# Patient Record
Sex: Male | Born: 1935 | Marital: Married
Health system: Southern US, Community
[De-identification: ages and names within clinical notes are randomized; demographics above are authoritative.]

---

## 2003-10-16 ENCOUNTER — Other Ambulatory Visit: Payer: Self-pay

## 2005-09-23 ENCOUNTER — Ambulatory Visit: Payer: Self-pay | Admitting: Surgery

## 2005-09-24 ENCOUNTER — Ambulatory Visit: Payer: Self-pay | Admitting: Surgery

## 2006-10-18 ENCOUNTER — Ambulatory Visit: Payer: Self-pay | Admitting: Surgery

## 2007-08-11 ENCOUNTER — Ambulatory Visit: Payer: Self-pay | Admitting: Internal Medicine

## 2007-10-25 ENCOUNTER — Other Ambulatory Visit: Payer: Self-pay

## 2007-10-26 ENCOUNTER — Inpatient Hospital Stay: Payer: Self-pay | Admitting: Internal Medicine

## 2007-10-28 ENCOUNTER — Other Ambulatory Visit: Payer: Self-pay

## 2007-10-30 ENCOUNTER — Other Ambulatory Visit: Payer: Self-pay

## 2007-10-31 ENCOUNTER — Other Ambulatory Visit: Payer: Self-pay

## 2007-11-21 ENCOUNTER — Ambulatory Visit: Payer: Self-pay | Admitting: Surgery

## 2008-12-03 ENCOUNTER — Ambulatory Visit: Payer: Self-pay | Admitting: Ophthalmology

## 2008-12-11 ENCOUNTER — Ambulatory Visit: Payer: Self-pay | Admitting: Ophthalmology

## 2010-07-16 ENCOUNTER — Ambulatory Visit: Payer: Self-pay | Admitting: Cardiology

## 2010-07-17 ENCOUNTER — Ambulatory Visit: Payer: Self-pay | Admitting: Cardiology

## 2011-03-12 ENCOUNTER — Ambulatory Visit: Payer: Self-pay | Admitting: Unknown Physician Specialty

## 2011-04-26 ENCOUNTER — Ambulatory Visit: Payer: Self-pay | Admitting: Surgery

## 2011-05-03 ENCOUNTER — Ambulatory Visit: Payer: Self-pay | Admitting: Surgery

## 2011-05-05 LAB — PATHOLOGY REPORT

## 2011-05-08 ENCOUNTER — Inpatient Hospital Stay: Payer: Self-pay | Admitting: Internal Medicine

## 2011-05-15 DIAGNOSIS — R079 Chest pain, unspecified: Secondary | ICD-10-CM

## 2011-05-18 LAB — BASIC METABOLIC PANEL
Anion Gap: 10 (ref 7–16)
BUN: 69 mg/dL — ABNORMAL HIGH (ref 7–18)
Calcium, Total: 9.4 mg/dL (ref 8.5–10.1)
Co2: 31 mmol/L (ref 21–32)
Creatinine: 2 mg/dL — ABNORMAL HIGH (ref 0.60–1.30)
EGFR (African American): 41 — ABNORMAL LOW
Osmolality: 310 (ref 275–301)

## 2011-06-18 DEATH — deceased

## 2012-08-17 IMAGING — CR DG CHEST 1V PORT
1 series · 1 of 1 positions shown · non-contrast
Comparison: none

REASON FOR EXAM: dyspnea
COMMENTS:

[portable]
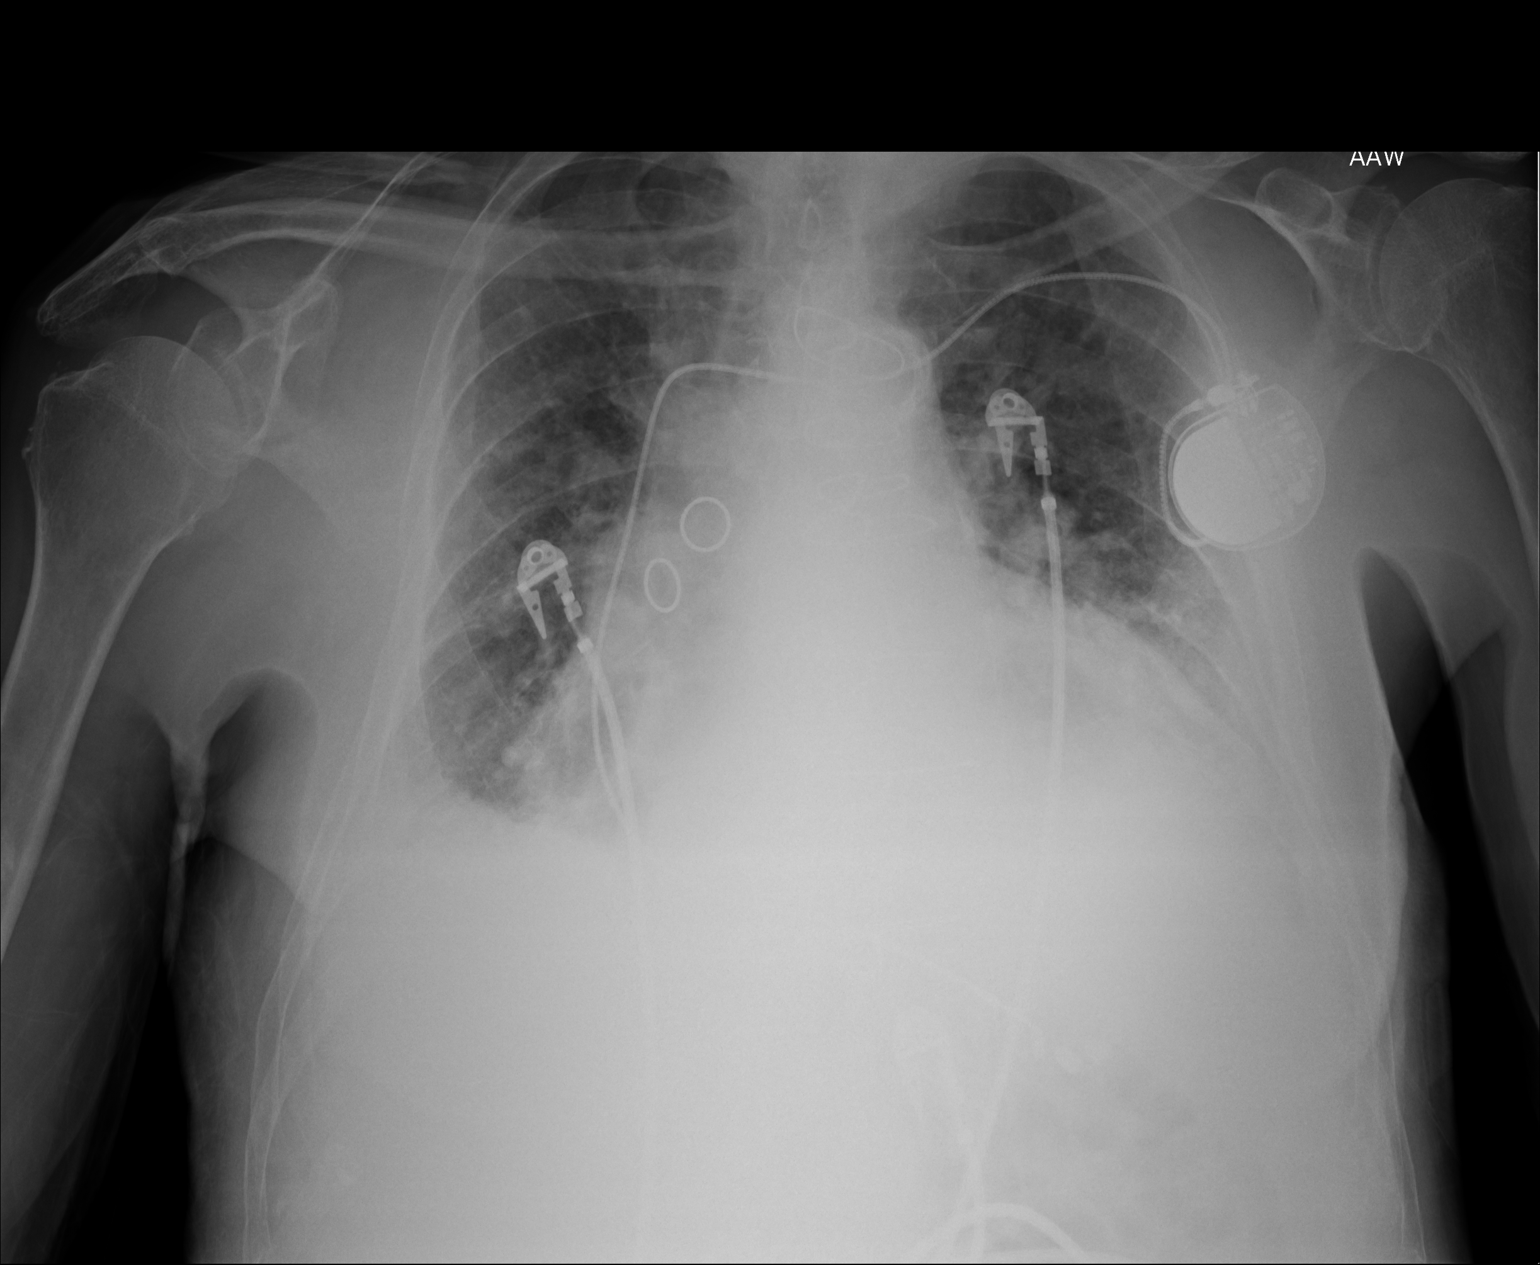

[1 of 1 positions shown; findings below may reference images not displayed]

PROCEDURE:     DXR - DXR PORTABLE CHEST SINGLE VIEW  - May 08, 2011  [DATE]

RESULT:     Comparison is made to the study April 26, 2011.

The lungs are less well inflated. There is blunting of the costophrenic
gutters and the left hemidiaphragm is less well demonstrated. The permanent
pacemaker is unchanged in appearance. The cardiac silhouette remains
enlarged. The pulmonary vascularity is engorged and the interstitial
markings of both lungs are mildly increased.
IMPRESSION: The findings are consistent with low-grade CHF. Small
bilateral pleural effusions have likely developed. I see no focal pneumonia.

## 2012-08-18 IMAGING — US US RENAL KIDNEY
1 series · 17 of 25 positions shown · non-contrast
Comparison: none

REASON FOR EXAM: Acute Renal Failure
COMMENTS:

[Series 1: us renal kidney · 17 of 27 slices shown]
[im 1/27]
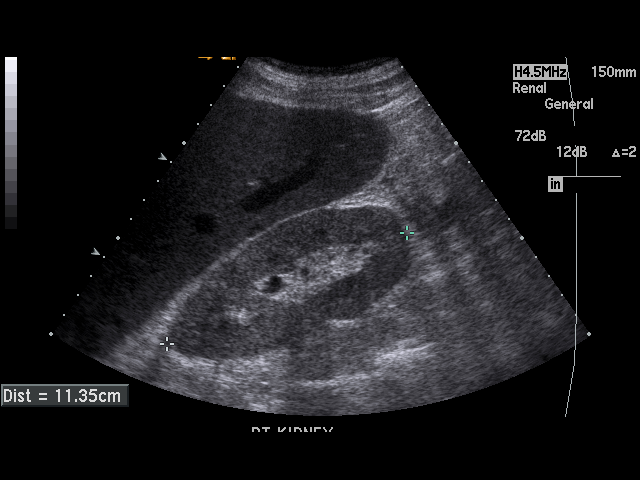
[im 3/27]
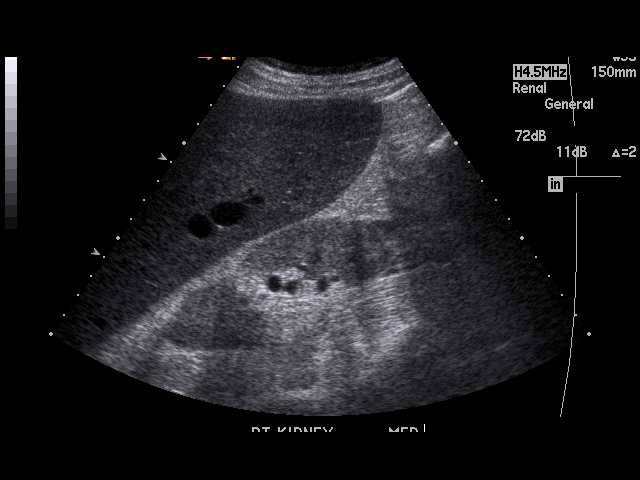
[im 4/27]
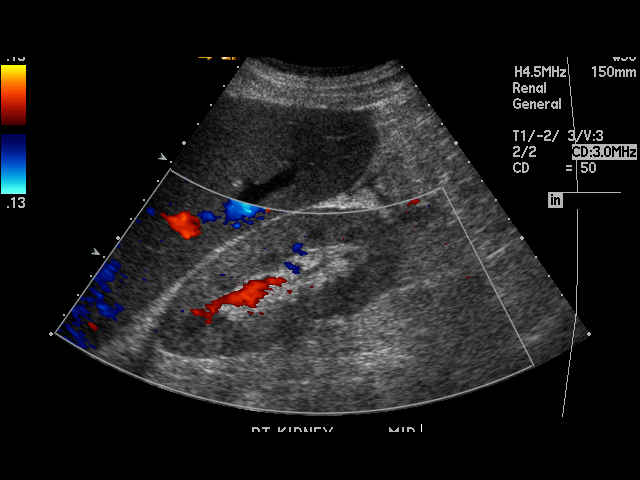
[im 6/27]
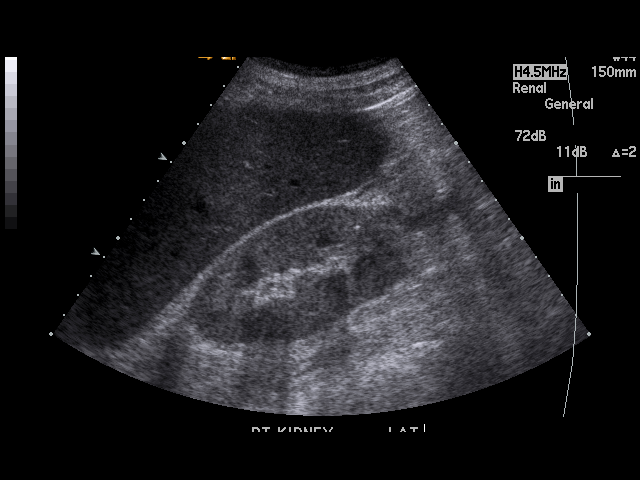
[im 7/27]
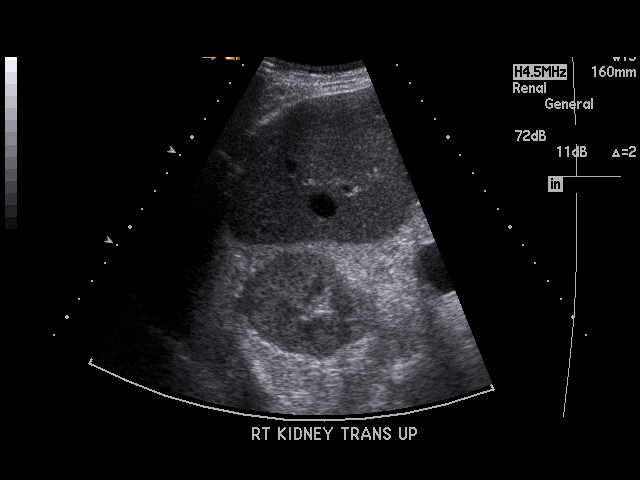
[im 9/27]
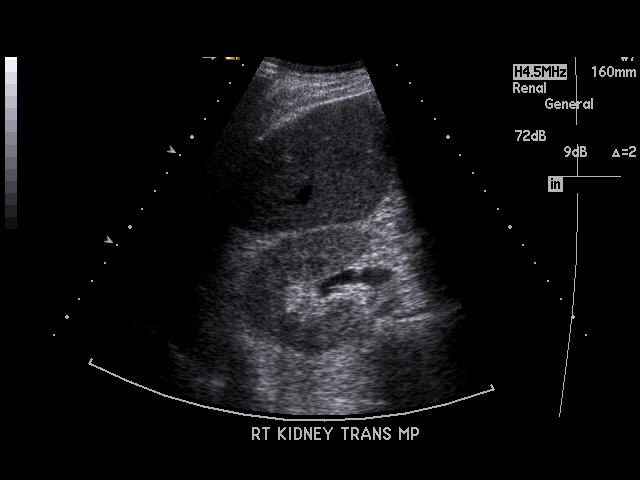
[im 10/27]
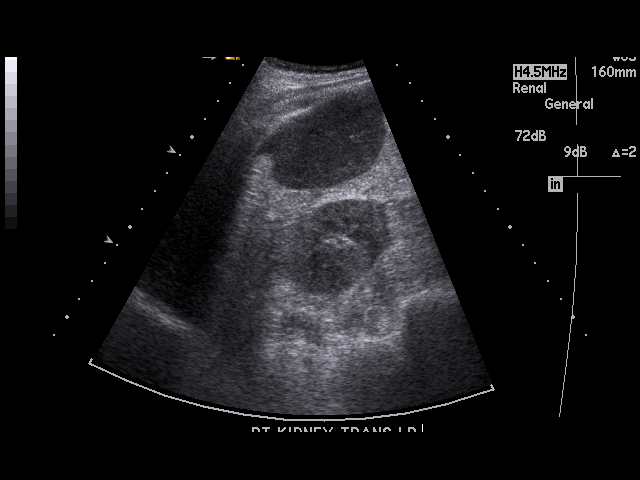
[im 12/27]
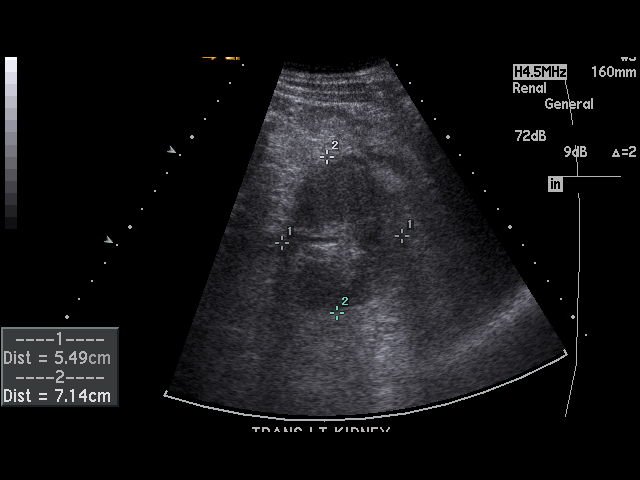
[im 14/27]
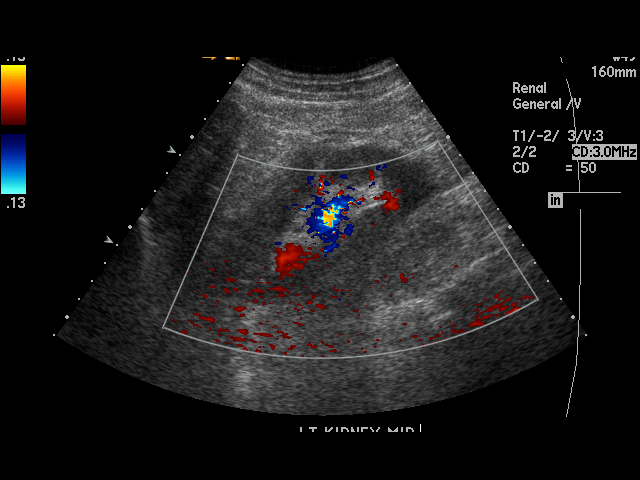
[im 15/27]
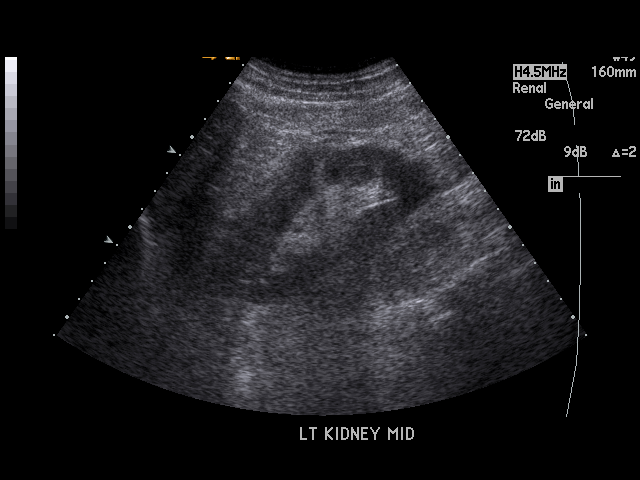
[im 17/27]
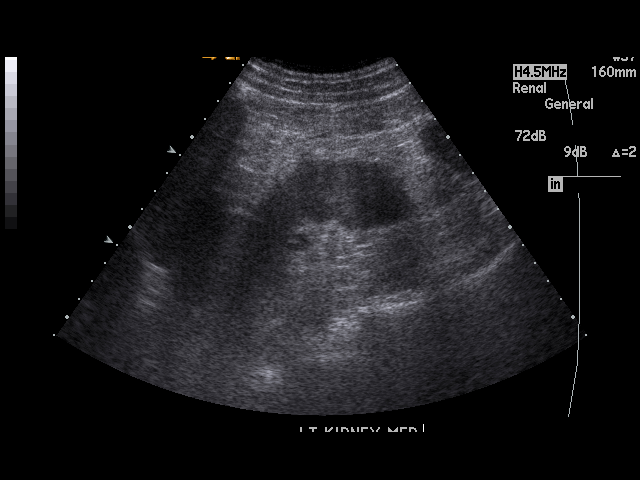
[im 18/27]
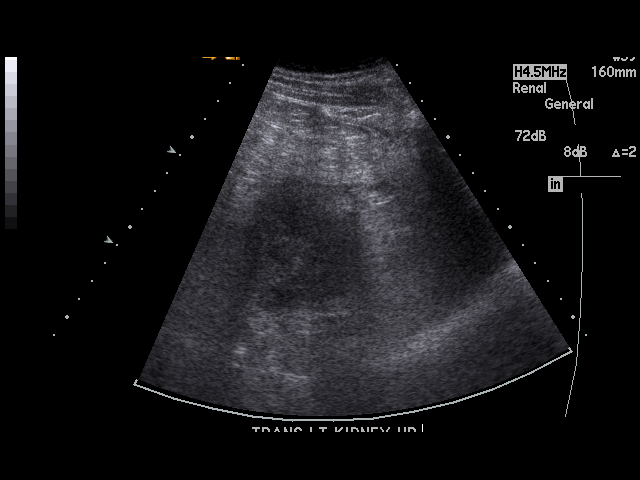
[im 20/27]
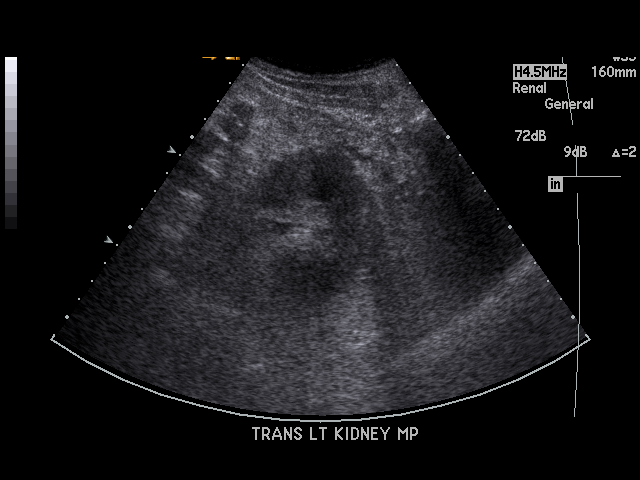
[im 21/27]
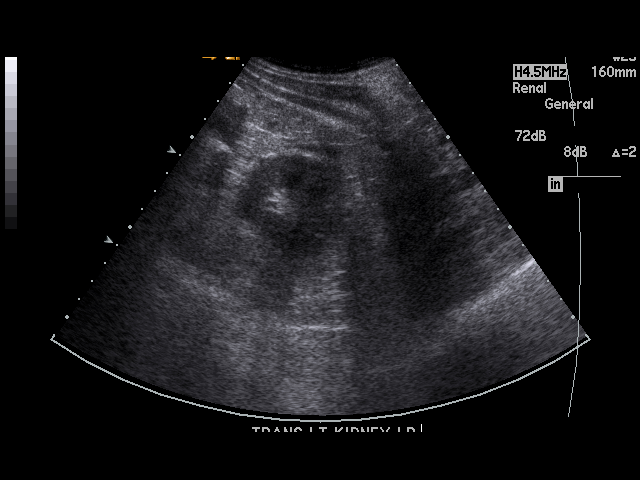
[im 23/27]
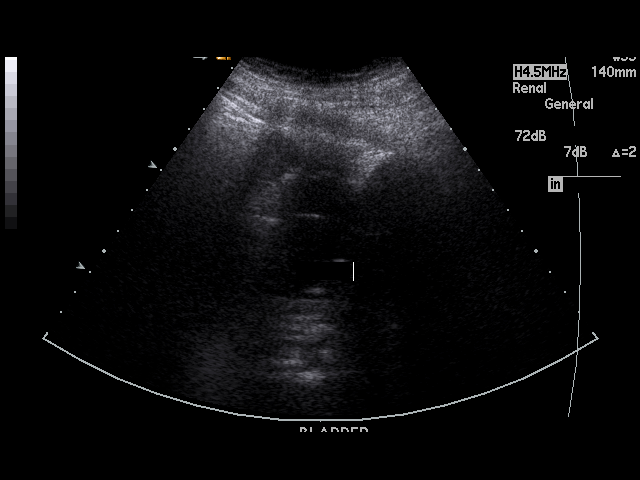
[im 24/27]
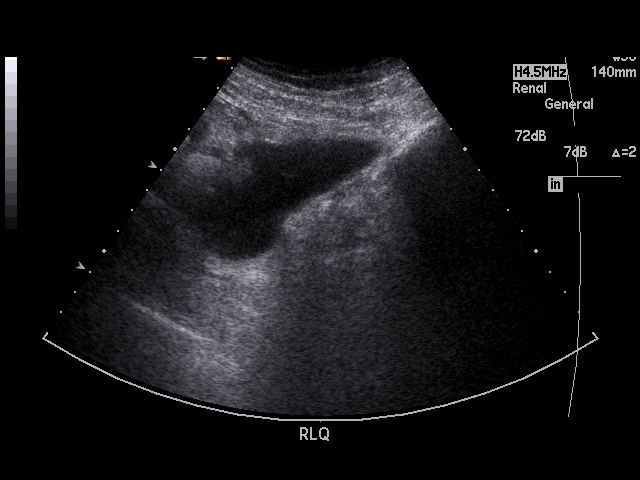
[im 27/27]
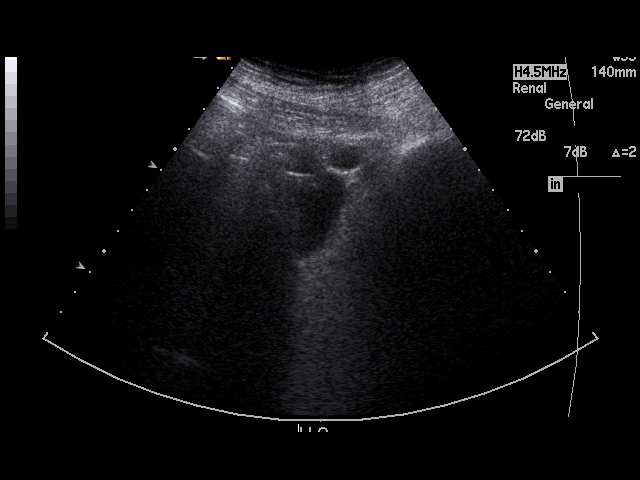

[17 of 25 positions shown; findings below may reference images not displayed]

PROCEDURE:     US  - US KIDNEY  - May 09, 2011 [DATE]

RESULT:     The right kidney measures 11.4 x 6.8 x 5.7 cm. The left kidney
measures 11.4 x 5.5 x 7.1 cm. The echotexture of the renal cortex on the
right is slightly greater than that of the adjacent liver consistent with
medical renal disease. This is similar on the left. There is no evidence of
hydronephrosis. The partially distended urinary bladder demonstrates the
presence of a Foley catheter.
IMPRESSION: There is increased echotexture within the kidneys
consistent with medical renal disease. I do not see evidence of obstruction.

## 2012-08-19 IMAGING — CR DG CHEST 2V
1 series · 2 of 2 positions shown · non-contrast
Comparison: none

REASON FOR EXAM: pna
COMMENTS:

[Series 3: x chest ap · 0.14mm/px · 2 of 2 slices shown]
[im 1/2]
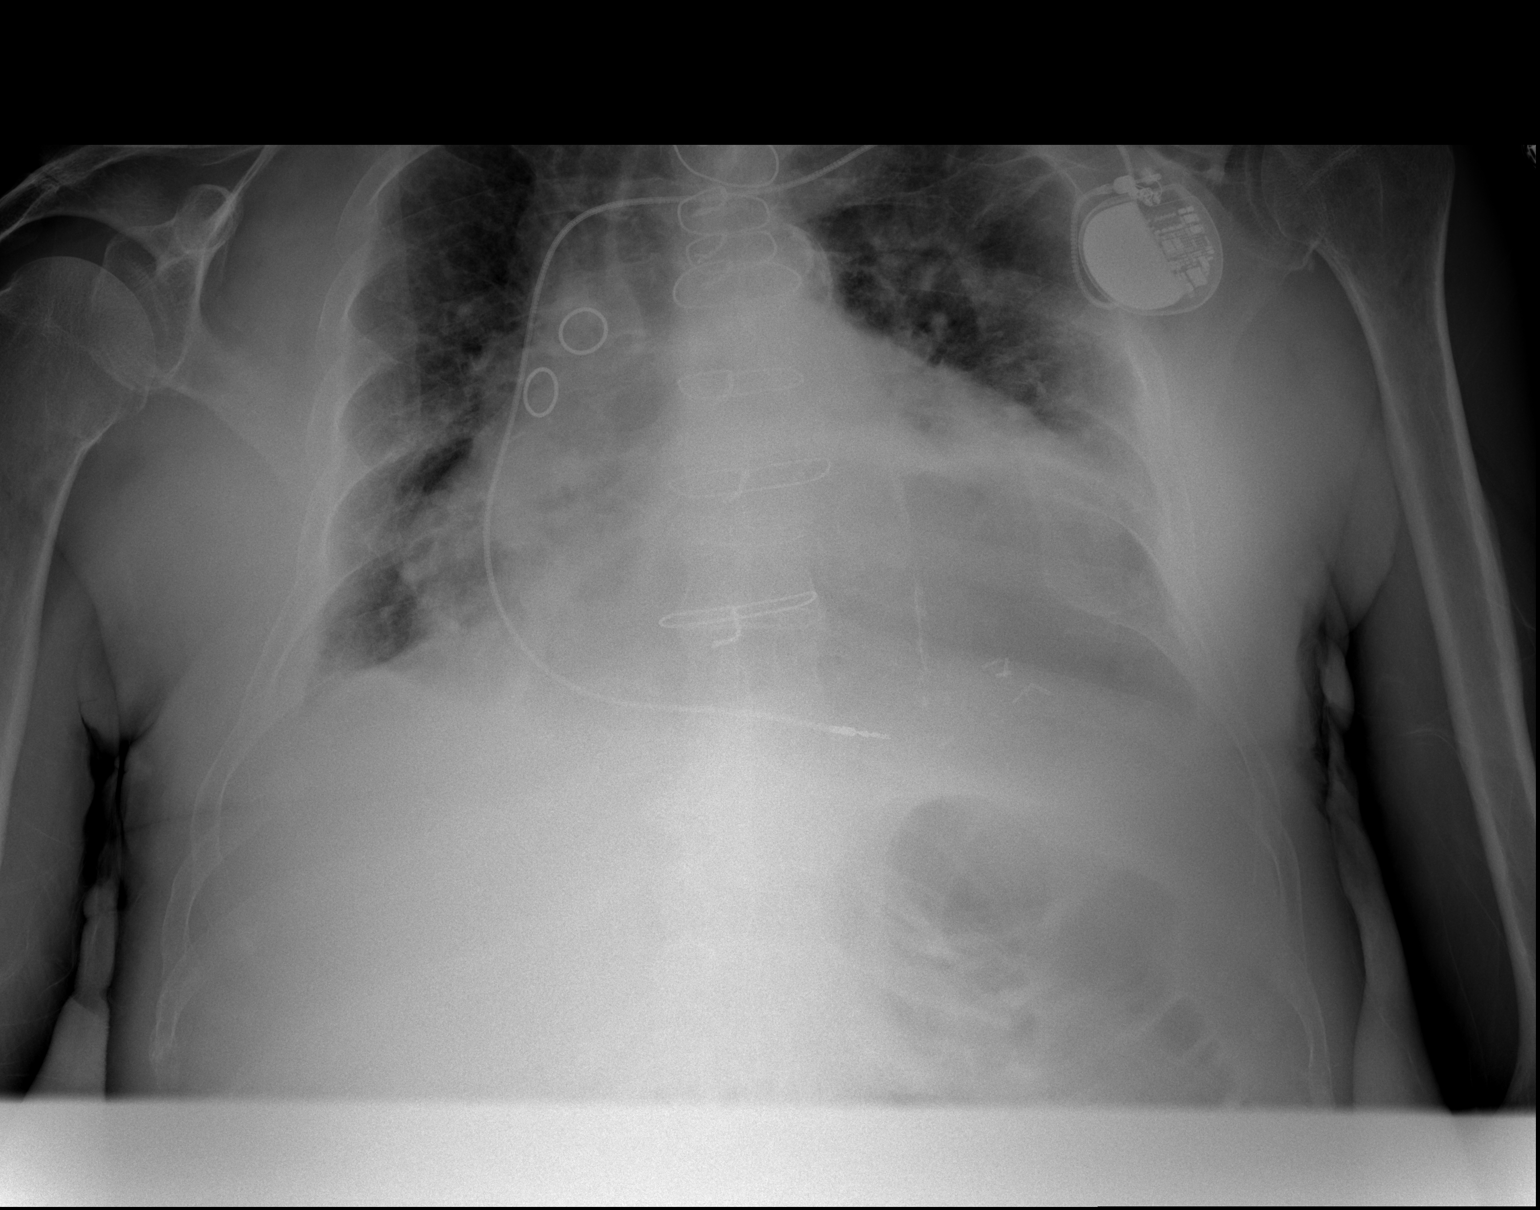
[im 2/2]
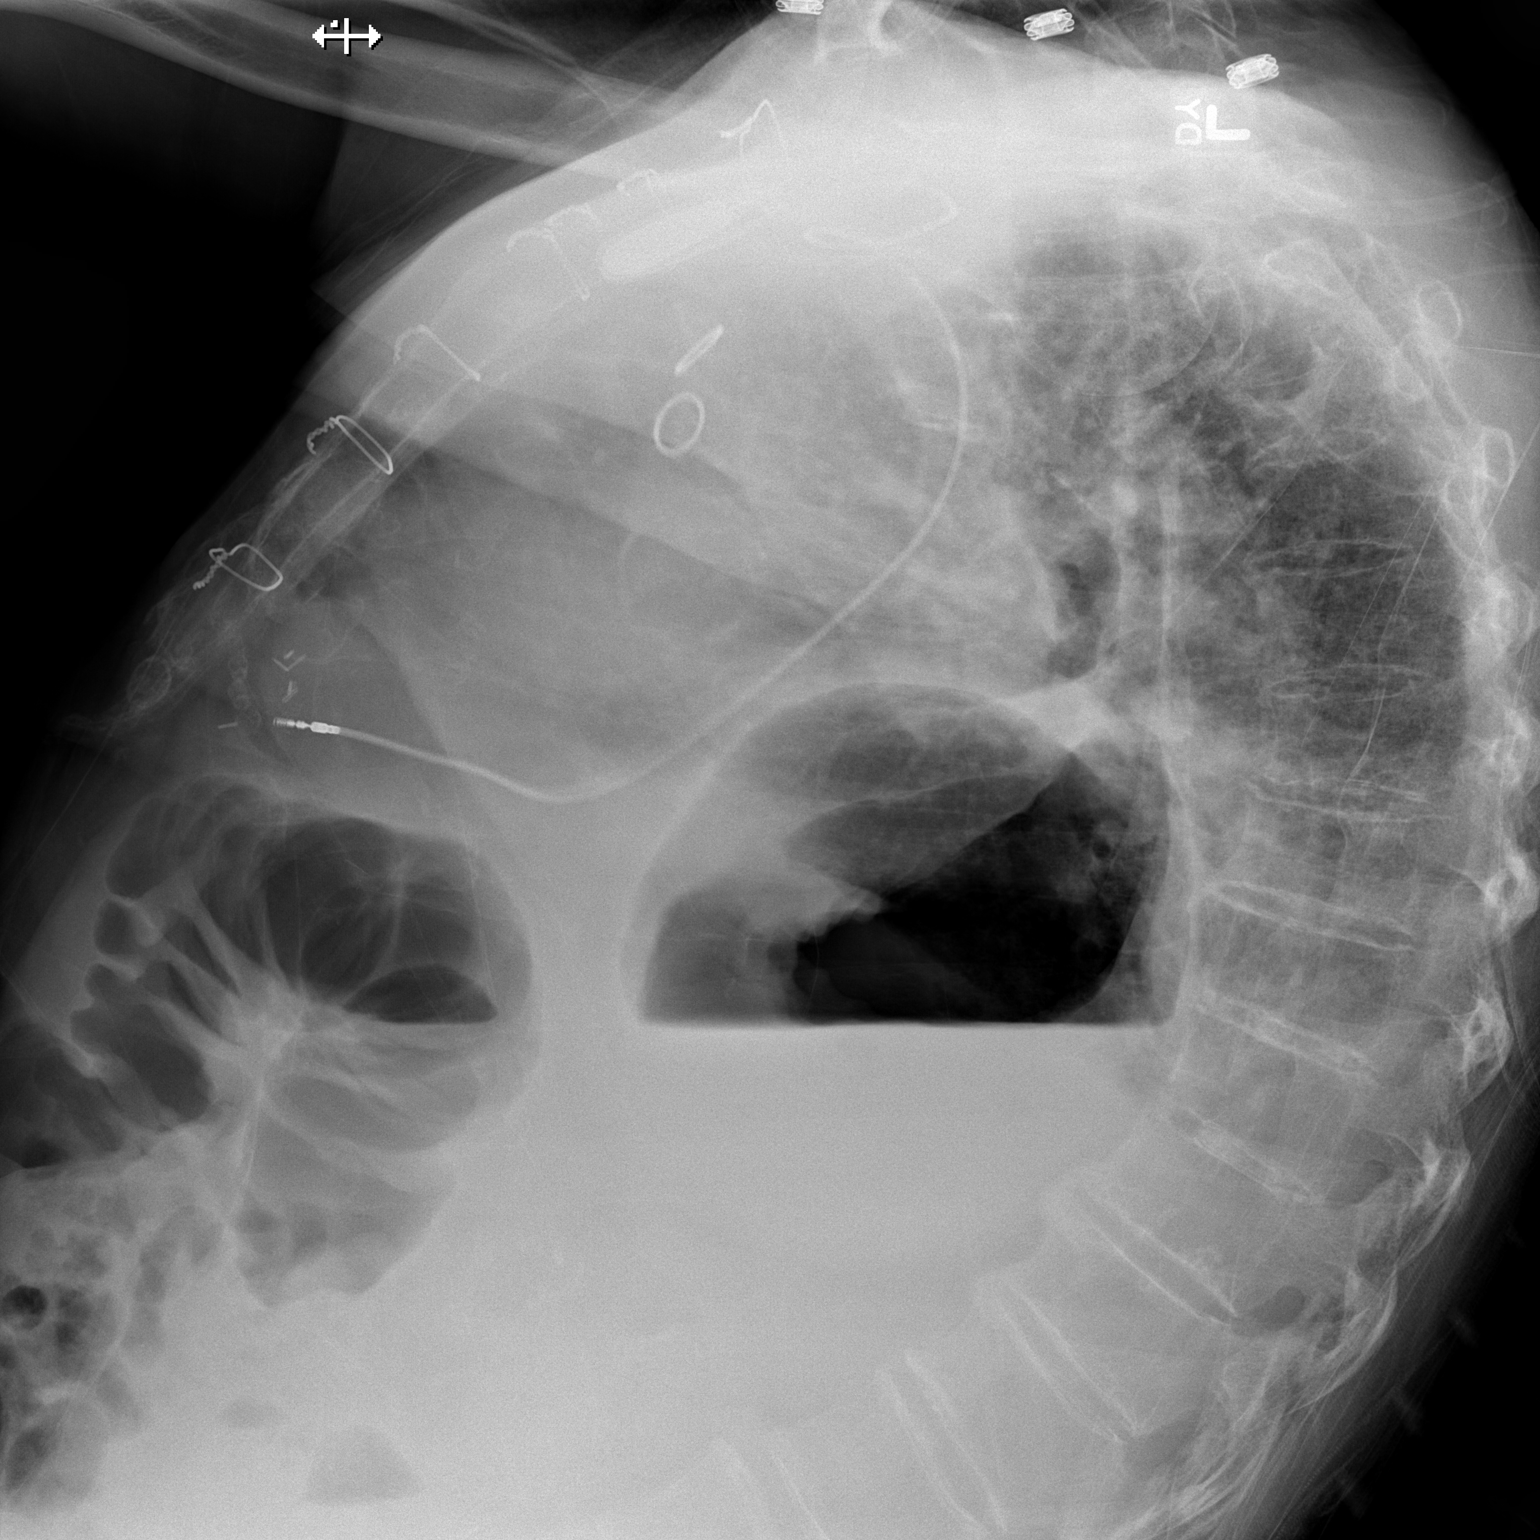

[2 of 2 positions shown; findings below may reference images not displayed]

PROCEDURE:     DXR - DXR CHEST PA (OR AP) AND LATERAL  - May 10, 2011  [DATE]

RESULT:     Comparison is made to a prior study dated 05/08/2011.

The patient has taken a shallow inspiration. With technique taken into
consideration, the cardiac silhouette is enlarged. There is mild prominence
of the interstitial markings. Air is appreciated within distended loops of
large bowel and the stomach. No focal regions of consolidation are
identified. A left-sided pectoralis pacing unit is appreciated.
IMPRESSION: 1. Shallow inspiration.
2. No focal regions of consolidation or focal infiltrates. The interstitial
prominence is likely due to technique as well as possibly an underlying
component of pulmonary fibrosis. Pulmonary vascular congestion cannot be
completely excluded.

## 2014-09-08 NOTE — Consult Note (Signed)
PATIENT NAME:  Victor Serrano, Victor Serrano MR#:  [id number] DATE OF BIRTH:  Sep 21, 1924  DATE OF CONSULTATION:  05/12/2011  REFERRING PHYSICIAN:  Nephrology CONSULTING PHYSICIAN:  Annice NeedyJason S. Dew, MD  REASON FOR CONSULTATION: Dialysis catheter placement.   HISTORY OF PRESENT ILLNESS: This is an 81 year old white male with worsening renal failure over several days. His BUN is now greater than 100 and his potassium is greater than 5. His mental status is altered and he is now in need of dialysis. History is obtained mostly from the previous medical record due to his lethargy and poor mental status.   PAST MEDICAL HISTORY:    1. Hypertension.  2. Hyperlipidemia.  3. Atrial fibrillation.  4. Coronary disease.  5. Arthritis.   PAST SURGICAL HISTORY:  1. Coronary artery bypass graft.  2. Pacemaker placement.  3. Inguinal hernia repairs.   ALLERGIES: None known.   HOME MEDICATIONS:  1. Digoxin 0.125 mg daily.  2. Diovan 160 mg daily.  3. Pravachol 20 mg daily.  4. Aspirin 81 mg daily.  5. Multivitamin daily.  6. Toprol 100 mg daily.  7. Welchol 650 q.i.d.  8. Potassium 20 mEq daily.  9. Metolazone 2.5 mg daily.  10. Torsemide 20 mg b.i.d.  11. Norco as needed for pain.   SOCIAL HISTORY: He quit tobacco many years ago. No alcohol abuse. Father had lung cancer. Mother had heart disease.   REVIEW OF SYSTEMS: Really not obtainable due to his altered mental status.   PHYSICAL EXAMINATION:  GENERAL: This is an elderly white male who was somewhat restless in bed, but not in apparent distress.   VITAL SIGNS: Temperature 96.5, pulse 64, blood pressure 114/47, saturations are 97%.   HEENT: Normocephalic, atraumatic.   EYES: Sclerae anicteric. Conjunctivae are clear.   EARS: Normal external appearance. Hearing difficult to assess.   NECK: Supple without adenopathy or jugular venous distention.   HEART: Irregularly irregular.   LUNGS: Diminished with rhonchi in the bases bilaterally.    ABDOMEN: Soft, nondistended, nontender.   EXTREMITIES: Mild to moderate lower extremity edema.   SKIN: Warm and dry. Pedal pulses are 1+.   LABORATORY VALUES: Sodium of 138, potassium 5.7, chloride 102, CO2 23, BUN is up to 125 and creatinine is 5.19. White blood cell count is 10.8, hemoglobin 16.0, platelet count 163,000.      ASSESSMENT AND PLAN: This is an 81 year old white male in need of dialysis access and a temporary dialysis catheter will be placed at the bedside. This is a level-3 consultation    ____________________________ Annice NeedyJason S. Dew, MD jsd:ap D: 06/03/2011 13:42:55 ET Serrano: 06/03/2011 14:00:16 ET JOB#: [id number]  cc: Annice NeedyJason S. Dew, MD, <Dictator> Annice NeedyJASON S DEW MD ELECTRONICALLY SIGNED 06/04/2011 7:57

## 2014-09-08 NOTE — Op Note (Signed)
PATIENT NAME:  Victor Serrano, Victor Serrano MR#:  [id number] DATE OF BIRTH:  January 16, 1925  DATE OF PROCEDURE:  05/12/2011  PREOPERATIVE DIAGNOSES:  1. Renal failure.  2. Hyperkalemia.  3. Uremia.   POSTOPERATIVE DIAGNOSES:  1. Renal failure.  2. Hyperkalemia.  3. Uremia.   PROCEDURES:  1. Ultrasound guidance for vascular access, right femoral vein.  2. Placement of right femoral DuoGlide dialysis catheter.   SURGEON: Annice NeedyJason S. Namiah Dunnavant, M.D.   ANESTHESIA: Local.   ESTIMATED BLOOD LOSS: Approximately 25 mL.   INDICATION FOR PROCEDURE: This is an 81 year old white male with progressive renal failure over several days. He now has hyperkalemia and uremia and is requiring dialysis and a temporary dialysis catheter is necessary. Risks and benefits were discussed. Informed consent was obtained.   DESCRIPTION OF PROCEDURE: The patient is laid flat on his floor bed. His right groin was sterilely prepped and draped and a sterile surgical field was created. Ultrasound was used to visualize a patent right femoral vein. The vein is accessed under direct ultrasound guidance without difficulty with a Seldinger needle. A three J-wire was placed. After skin nick and dilatation the DuoGlide dialysis catheter was placed over the wire and the wire was removed. Both ports withdrew blood well and flushed easily with sterile saline. It was secured with 3 nylon sutures to the skin. This was a 30-cm in length catheter.    Sterile dressing was placed.   ____________________________ Annice NeedyJason S. Aleysha Meckler, MD jsd:bjt D: 05/12/2011 17:38:00 ET Serrano: 05/13/2011 09:42:11 ET JOB#: [id number]  cc: Annice NeedyJason S. Justice Aguirre, MD, <Dictator> Annice NeedyJASON S Aarush Stukey MD ELECTRONICALLY SIGNED 06/04/2011 7:57

## 2014-09-08 NOTE — Discharge Summary (Signed)
PATIENT NAME:  Victor Serrano, Victor Serrano MR#:  [id number] DATE OF BIRTH:  03-Nov-1924  DATE OF ADMISSION:  05/08/2011 DATE OF DISCHARGE:  05/19/2011  CODE STATUS: DO NOT RESUSCITATE.   DISCHARGE DIAGNOSES:  1. Acute on chronic renal failure. 2. History of chronic kidney disease, stage III.  3. History of systolic heart failure.  4. Hypertension.  5. Hyperlipidemia.  6. Arthritis.   DISCHARGE MEDICATIONS:  1. Pravastatin 20 mg daily. 2. Aspirin 81 mg p.o. daily. 3. Welchol 625 mg four tablets in the morning. 4. Arthritis Pain Relief 2 tablets every six hours p.r.n.  5. Metoprolol 25 mg p.o. daily.   NOTE: Do not take the following medications: Digoxin, Diovan, Torsemide, Metolazone, and Metoprolol dose has been decreased.   DISCHARGE FOLLOWUP: Followup with Dr. Lavonia Dana in 2 weeks at the [facility].  CONSULTANTS:  1. Leotis Pain, MD - Vascular. 2. Anthonette Legato, MD - Nephrology.  HOSPITAL COURSE:  1. The patient is an 81 year old male with history of hypertension, hyperlipidemia, chronic atrial fibrillation status post pacemaker, history of coronary artery disease, and history of inguinal hernia repair who came in because the patient was having falls, weakness and lethargy. The patient also had diffuse body pain. The patient was admitted to hospitalist service for acute on chronic renal failure with BUN of 100 and creatinine of 5.5. The patient's metolazone and Diovan along with Torsemide were held. Nephrology consultation with Dr. Murlean Iba was obtained. The patient was also started on IV fluids. Chest x-ray showed low-grade congestive heart failure on admission with no evidence of pneumonia. The patient's BUN was 8. Ultrasound showed increase in echotexture consistent with medical renal disease. The patient's BUN and creatinine were persistently elevated to a creatinine  of 5 and BUN of 107. He had minimal urine output so the patient had a temporary HD catheter placed by Dr.  Lucky Cowboy on 05/12/2011. The patient had hemodialysis sessions, on 12/26, 12/27, and 05/14/2011. The patient's BUN and creatinine improved on 05/15/2011 to a BUN of 66 and creatinine of 2.49. The patient also had ANCA and ANA panel and anti-GBM antibodies; they are all negative. The patient's serum protein electrophoresis also is negative. The patient persistently had increased urine output. He is being followed by nephrology. His hemodialysis catheter was removed on 05/16/2011. His urine output has been about 1.5 liters this, since yesterday, and the patient's creatinine has been stabilized at 2 and BUN is 69 so it is recommended that nephrology can see him in the clinic in two weeks. Dr. Lavonia Dana came to see him and he can have repeat labs done at that time and assess for further need, but he is not on Lasix or Aldactone or any metolazone here.  2. History of systolic heart failure: The patient was seen by Dr. Nehemiah Massed and he recommended no further intervention. The patient's ejection fraction has been around 35%. He has history of coronary artery disease and also history of atrial fibrillation. The patient's blood pressure is on the low side. He was taking metoprolol 100 mg daily along with digoxin. Digoxin was stopped when he came in because of acute renal failure. His metoprolol was started at a lower dose, 25 mg daily. The patient's heart rate has been around 65 to 80 and blood pressure has been stable at 130/80. He is to continue his metoprolol and aspirin and also monitor his heart rate.  3. Systolic heart failure and mild hypoxia: The patient is on 3 liters. We will titrate  it down to 2 liters and continue oxygen at 2 liters. His saturations are 98%.  4. The patient does have some arthritis and takes Tylenol. He also has gout for which he was given prednisone during the hospital stay. He is on tapering dose of prednisone. 5. History of indigestion: He is on PPIs.  6. Hyperlipidemia: Continue Pravachol  and Welchol.          The patient's condition is stable at this time.   TIME SPENT ON DISCHARGE PREPARATION: More than 30 minutes. ____________________________ Epifanio Lesches, MD sk:slb D: 05/19/2011 11:37:00 ET T: 05/19/2011 14:03:02 ET JOB#: [id number]  cc: Epifanio Lesches, MD, <Dictator> Epifanio Lesches MD ELECTRONICALLY SIGNED 05/23/2011 21:58
